# Patient Record
Sex: Male | Born: 1980 | Race: White | Hispanic: No | Marital: Married | Smoking: Former smoker
Health system: Southern US, Community
[De-identification: ages and names within clinical notes are randomized; demographics above are authoritative.]

## PROBLEM LIST (undated history)

## (undated) DIAGNOSIS — Z87891 Personal history of nicotine dependence: Secondary | ICD-10-CM

## (undated) HISTORY — DX: Personal history of nicotine dependence: Z87.891

## (undated) HISTORY — PX: TONSILLECTOMY: SUR1361

## (undated) HISTORY — PX: SHOULDER ARTHROSCOPY: SHX128

---

## 2016-02-15 ENCOUNTER — Emergency Department (HOSPITAL_COMMUNITY): Payer: No Typology Code available for payment source

## 2016-02-15 ENCOUNTER — Emergency Department (HOSPITAL_COMMUNITY)
Admission: EM | Admit: 2016-02-15 | Discharge: 2016-02-15 | Disposition: A | Discharge status: Home / Self Care | Payer: No Typology Code available for payment source | Attending: Emergency Medicine | Admitting: Emergency Medicine

## 2016-02-15 ENCOUNTER — Encounter (HOSPITAL_COMMUNITY): Payer: Self-pay | Admitting: Emergency Medicine

## 2016-02-15 DIAGNOSIS — S39012A Strain of muscle, fascia and tendon of lower back, initial encounter: Secondary | ICD-10-CM | POA: Diagnosis not present

## 2016-02-15 DIAGNOSIS — Y999 Unspecified external cause status: Secondary | ICD-10-CM | POA: Insufficient documentation

## 2016-02-15 DIAGNOSIS — Y92410 Unspecified street and highway as the place of occurrence of the external cause: Secondary | ICD-10-CM | POA: Insufficient documentation

## 2016-02-15 DIAGNOSIS — M25511 Pain in right shoulder: Secondary | ICD-10-CM | POA: Diagnosis not present

## 2016-02-15 DIAGNOSIS — S161XXA Strain of muscle, fascia and tendon at neck level, initial encounter: Secondary | ICD-10-CM | POA: Insufficient documentation

## 2016-02-15 DIAGNOSIS — Y939 Activity, unspecified: Secondary | ICD-10-CM | POA: Insufficient documentation

## 2016-02-15 DIAGNOSIS — Z87891 Personal history of nicotine dependence: Secondary | ICD-10-CM | POA: Insufficient documentation

## 2016-02-15 DIAGNOSIS — S199XXA Unspecified injury of neck, initial encounter: Secondary | ICD-10-CM | POA: Diagnosis present

## 2016-02-15 MED ORDER — ACETAMINOPHEN 500 MG PO TABS
1000.0000 mg | ORAL_TABLET | Freq: Once | ORAL | Status: AC
  Administered 2016-02-15: 1000 mg via ORAL
  Filled 2016-02-15: qty 2

## 2016-02-15 MED ORDER — CYCLOBENZAPRINE HCL 5 MG PO TABS: 5.0000 mg | ORAL_TABLET | Freq: Three times a day (TID) | ORAL | 0 refills | Status: DC | PRN

## 2016-02-15 MED ORDER — IBUPROFEN 600 MG PO TABS: 600.0000 mg | ORAL_TABLET | Freq: Four times a day (QID) | ORAL | 0 refills | Status: DC | PRN

## 2016-02-15 NOTE — ED Notes (Signed)
Patient ambulatory to Xray.

## 2016-02-15 NOTE — ED Notes (Signed)
ED Provider at bedside to discuss plan of care and disposition
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
## 2016-03-01 ENCOUNTER — Telehealth: Payer: Self-pay | Admitting: Orthopaedic Surgery
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
## 2016-05-12 ENCOUNTER — Encounter (HOSPITAL_COMMUNITY): Payer: Self-pay | Admitting: Emergency Medicine

## 2016-05-12 ENCOUNTER — Emergency Department (HOSPITAL_COMMUNITY)
Admission: EM | Admit: 2016-05-12 | Discharge: 2016-05-12 | Disposition: A | Discharge status: Home / Self Care | Payer: No Typology Code available for payment source | Attending: Emergency Medicine | Admitting: Emergency Medicine

## 2016-05-12 ENCOUNTER — Emergency Department (HOSPITAL_COMMUNITY): Payer: No Typology Code available for payment source

## 2016-05-12 DIAGNOSIS — S39012A Strain of muscle, fascia and tendon of lower back, initial encounter: Secondary | ICD-10-CM | POA: Diagnosis not present

## 2016-05-12 DIAGNOSIS — Z87891 Personal history of nicotine dependence: Secondary | ICD-10-CM | POA: Insufficient documentation

## 2016-05-12 DIAGNOSIS — S161XXA Strain of muscle, fascia and tendon at neck level, initial encounter: Secondary | ICD-10-CM | POA: Insufficient documentation

## 2016-05-12 DIAGNOSIS — Y9389 Activity, other specified: Secondary | ICD-10-CM | POA: Insufficient documentation

## 2016-05-12 DIAGNOSIS — Y92410 Unspecified street and highway as the place of occurrence of the external cause: Secondary | ICD-10-CM | POA: Diagnosis not present

## 2016-05-12 DIAGNOSIS — Y999 Unspecified external cause status: Secondary | ICD-10-CM | POA: Diagnosis not present

## 2016-05-12 DIAGNOSIS — S199XXA Unspecified injury of neck, initial encounter: Secondary | ICD-10-CM | POA: Diagnosis present

## 2016-05-12 MED ORDER — METHOCARBAMOL 500 MG PO TABS: 500.0000 mg | ORAL_TABLET | Freq: Four times a day (QID) | ORAL | 0 refills | Status: DC

## 2016-05-12 MED ORDER — DICLOFENAC SODIUM 50 MG PO TBEC: 50.0000 mg | DELAYED_RELEASE_TABLET | Freq: Two times a day (BID) | ORAL | 0 refills | Status: DC
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
## 2016-05-12 NOTE — Discharge Instructions (Signed)
See your Physician for recheck in 1 week if pain persist °
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
## 2016-05-12 NOTE — ED Notes (Signed)
C-collar applied to pt.

## 2017-04-17 ENCOUNTER — Other Ambulatory Visit (HOSPITAL_COMMUNITY): Payer: Self-pay | Admitting: Nurse Practitioner

## 2017-04-17 DIAGNOSIS — R1030 Lower abdominal pain, unspecified: Secondary | ICD-10-CM

## 2017-04-17 DIAGNOSIS — N5082 Scrotal pain: Secondary | ICD-10-CM
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
## 2021-03-06 DIAGNOSIS — G8929 Other chronic pain: Secondary | ICD-10-CM

## 2021-03-06 HISTORY — DX: Other chronic pain: G89.29

## 2021-04-01 ENCOUNTER — Encounter: Payer: Self-pay | Admitting: Medical

## 2021-04-01 ENCOUNTER — Other Ambulatory Visit: Payer: Self-pay

## 2021-04-01 ENCOUNTER — Ambulatory Visit (INDEPENDENT_AMBULATORY_CARE_PROVIDER_SITE_OTHER): Payer: BC Managed Care – PPO | Admitting: Medical

## 2021-04-01 VITALS — BP 130/80 | HR 97 | Ht 71.0 in | Wt 184.2 lb

## 2021-04-01 DIAGNOSIS — M549 Dorsalgia, unspecified: Secondary | ICD-10-CM | POA: Diagnosis not present

## 2021-04-01 DIAGNOSIS — M25519 Pain in unspecified shoulder: Secondary | ICD-10-CM

## 2021-04-01 DIAGNOSIS — R0789 Other chest pain: Secondary | ICD-10-CM

## 2021-04-01 DIAGNOSIS — Z13220 Encounter for screening for lipoid disorders: Secondary | ICD-10-CM | POA: Insufficient documentation

## 2021-04-01 DIAGNOSIS — Z0000 Encounter for general adult medical examination without abnormal findings: Secondary | ICD-10-CM | POA: Diagnosis not present

## 2021-04-01 DIAGNOSIS — G8929 Other chronic pain: Secondary | ICD-10-CM

## 2021-04-01 DIAGNOSIS — M25512 Pain in left shoulder: Secondary | ICD-10-CM | POA: Insufficient documentation

## 2021-04-01 DIAGNOSIS — Z23 Encounter for immunization: Secondary | ICD-10-CM | POA: Diagnosis not present
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
## 2021-04-01 NOTE — Addendum Note (Signed)
Addended by: Herminio Commons A on: 04/01/2021 11:56 AM   Modules accepted: Orders

## 2021-04-02 LAB — URINALYSIS, ROUTINE W REFLEX MICROSCOPIC
Bilirubin, UA: NEGATIVE
Glucose, UA: NEGATIVE
Nitrite, UA: NEGATIVE
RBC, UA: NEGATIVE
Specific Gravity, UA: 1.025 (ref 1.005–1.030)
Urobilinogen, Ur: 1 mg/dL (ref 0.2–1.0)
pH, UA: 5.5 (ref 5.0–7.5)

## 2021-04-02 LAB — CBC
Hematocrit: 46.9 % (ref 37.5–51.0)
Hemoglobin: 16.4 g/dL (ref 13.0–17.7)
MCH: 30.6 pg (ref 26.6–33.0)
MCHC: 35 g/dL (ref 31.5–35.7)
MCV: 88 fL (ref 79–97)
Platelets: 252 10*3/uL (ref 150–450)
RBC: 5.36 x10E6/uL (ref 4.14–5.80)
RDW: 12.4 % (ref 11.6–15.4)
WBC: 8.9 10*3/uL (ref 3.4–10.8)

## 2021-04-02 LAB — COMPREHENSIVE METABOLIC PANEL
ALT: 18 IU/L (ref 0–44)
AST: 16 IU/L (ref 0–40)
Albumin/Globulin Ratio: 2.2 (ref 1.2–2.2)
Albumin: 4.8 g/dL (ref 4.0–5.0)
Alkaline Phosphatase: 79 IU/L (ref 44–121)
BUN/Creatinine Ratio: 10 (ref 9–20)
BUN: 13 mg/dL (ref 6–24)
Bilirubin Total: 0.7 mg/dL (ref 0.0–1.2)
CO2: 29 mmol/L (ref 20–29)
Calcium: 10.2 mg/dL (ref 8.7–10.2)
Chloride: 100 mmol/L (ref 96–106)
Creatinine, Ser: 1.35 mg/dL — ABNORMAL HIGH (ref 0.76–1.27)
Globulin, Total: 2.2 g/dL (ref 1.5–4.5)
Glucose: 106 mg/dL — ABNORMAL HIGH (ref 70–99)
Potassium: 4.8 mmol/L (ref 3.5–5.2)
Sodium: 141 mmol/L (ref 134–144)
Total Protein: 7 g/dL (ref 6.0–8.5)
eGFR: 68 mL/min/{1.73_m2} (ref >59)

## 2021-04-02 LAB — LIPID PANEL
Chol/HDL Ratio: 5.9 ratio — ABNORMAL HIGH (ref 0.0–5.0)
Cholesterol, Total: 248 mg/dL — ABNORMAL HIGH (ref 100–199)
HDL: 42 mg/dL (ref >39)
LDL Chol Calc (NIH): 164 mg/dL — ABNORMAL HIGH (ref 0–99)
Triglycerides: 227 mg/dL — ABNORMAL HIGH (ref 0–149)
VLDL Cholesterol Cal: 42 mg/dL — ABNORMAL HIGH (ref 5–40)

## 2021-04-02 LAB — CK: Total CK: 81 U/L (ref 49–439)

## 2021-04-02 LAB — MICROSCOPIC EXAMINATION
Bacteria, UA: NONE SEEN
Casts: NONE SEEN /lpf
WBC, UA: NONE SEEN /hpf (ref 0–5)

## 2021-05-13 DIAGNOSIS — M542 Cervicalgia: Secondary | ICD-10-CM | POA: Diagnosis not present

## 2021-05-13 DIAGNOSIS — M545 Low back pain, unspecified: Secondary | ICD-10-CM | POA: Diagnosis not present

## 2021-06-10 DIAGNOSIS — M545 Low back pain, unspecified: Secondary | ICD-10-CM | POA: Diagnosis not present

## 2021-07-01 ENCOUNTER — Ambulatory Visit: Payer: BC Managed Care – PPO | Admitting: Medical

## 2021-07-08 ENCOUNTER — Encounter: Payer: Self-pay | Admitting: Medical

## 2021-07-08 ENCOUNTER — Ambulatory Visit: Payer: BC Managed Care – PPO | Admitting: Medical

## 2021-07-08 VITALS — BP 114/72 | HR 79 | Temp 97.3°F | Wt 185.8 lb

## 2021-07-08 DIAGNOSIS — R5383 Other fatigue: Secondary | ICD-10-CM

## 2021-07-08 DIAGNOSIS — R7309 Other abnormal glucose: Secondary | ICD-10-CM

## 2021-07-08 DIAGNOSIS — G479 Sleep disorder, unspecified: Secondary | ICD-10-CM

## 2021-07-08 DIAGNOSIS — R7989 Other specified abnormal findings of blood chemistry: Secondary | ICD-10-CM | POA: Diagnosis not present

## 2021-07-08 DIAGNOSIS — E7889 Other lipoprotein metabolism disorders: Secondary | ICD-10-CM

## 2021-07-08 NOTE — Progress Notes (Signed)
Subjective: ? Rod Majerus is a 41 y.o. male who presents for ?Chief Complaint  ?Patient presents with  ? Follow-up  ?  On labs pt is fasting   ?   ?Here for recheck.  At his physical in 03/2021, labs showed elevated glucose, lipids, and mildly elevated creatine.  Here to recheck.  Since last visit has made significant dietary changes.  Cut out sodas, heavy breakfast sandwiches, pizza.  Eating more greek yogurt, less breakfast samples, pretzels ? ?Has concerns about fatigue.  A friend of his has low testosterone.  Wants this checked.  Only gets 4-5 hours per night of sleep.  Has horses and other animals he tends to daily.  Drives 2 hour commute daily.  Has fit bit but it hasn't registered any abnormal EKG or abnormal sleep concerns ? ?No witnessed apnea, no loud snoring that he is aware of ? ?No other aggravating or relieving factors.   ? ?No other c/o. ? ?The following portions of the patient's history were reviewed and updated as appropriate: allergies, current medications, past family history, past medical history, past social history, past surgical history and problem list. ? ?ROS ?Otherwise as in subjective above ? ? ? ?Objective: ?BP 114/72   Pulse 79   Temp (!) 97.3 ?F (36.3 ?C)   Wt 185 lb 12.8 oz (84.3 kg)   SpO2 97%   BMI 25.91 kg/m?  ? ?General appearance: alert, no distress, well developed, well nourished ?Otherwise not examined ? ? ? ?Assessment: ?Encounter Diagnoses  ?Name Primary?  ? Elevated glucose Yes  ? Lipids abnormal   ? Elevated serum creatinine   ? Other fatigue   ? ? ? ?Plan: ?Elevated lipids-he has made dietary changes since last visit.  Updated fasting lipids today ? ?Elevated glucose-updated labs today since he has made dietary changes ? ?Elevated creatinine on labs in January.  We discussed possible causes.  He has no known hypertension, no heavy NSAID use, but he says he probably could drink more water in general.  He was also sick a week or so before the visit we saw him as he  thinks he may have been a little dehydrated ? ?Testosterone level today at his request ? ? ?Jansen was seen today for follow-up. ? ?Diagnoses and all orders for this visit: ? ?Elevated glucose ?-     Hemoglobin A1c ?-     Basic metabolic panel ? ?Lipids abnormal ?-     Lipid panel ? ?Elevated serum creatinine ?-     Basic metabolic panel ? ?Other fatigue ?-     Testosterone ? ? ?Follow up: pending labs, yearly physical ?

## 2021-07-09 LAB — BASIC METABOLIC PANEL
BUN/Creatinine Ratio: 15 (ref 9–20)
BUN: 20 mg/dL (ref 6–24)
CO2: 27 mmol/L (ref 20–29)
Calcium: 9.8 mg/dL (ref 8.7–10.2)
Chloride: 99 mmol/L (ref 96–106)
Creatinine, Ser: 1.32 mg/dL — ABNORMAL HIGH (ref 0.76–1.27)
Glucose: 109 mg/dL — ABNORMAL HIGH (ref 70–99)
Potassium: 4.6 mmol/L (ref 3.5–5.2)
Sodium: 140 mmol/L (ref 134–144)
eGFR: 70 mL/min/{1.73_m2} (ref >59)

## 2021-07-09 LAB — LIPID PANEL
Chol/HDL Ratio: 5.3 ratio — ABNORMAL HIGH (ref 0.0–5.0)
Cholesterol, Total: 239 mg/dL — ABNORMAL HIGH (ref 100–199)
HDL: 45 mg/dL (ref >39)
LDL Chol Calc (NIH): 170 mg/dL — ABNORMAL HIGH (ref 0–99)
Triglycerides: 134 mg/dL (ref 0–149)
VLDL Cholesterol Cal: 24 mg/dL (ref 5–40)

## 2021-07-09 LAB — TESTOSTERONE: Testosterone: 433 ng/dL (ref 264–916)

## 2021-07-09 LAB — HEMOGLOBIN A1C
Est. average glucose Bld gHb Est-mCnc: 117 mg/dL
Hgb A1c MFr Bld: 5.7 % — ABNORMAL HIGH (ref 4.8–5.6)

## 2021-08-17 ENCOUNTER — Telehealth: Payer: Self-pay | Admitting: Medical

## 2021-08-17 NOTE — Telephone Encounter (Signed)
Pt called, they rescued some kittens and have been handling them for about 7 weeks and they have round worms He had read that you can get a preventative for round worms that you take for about 3 days He wanted to know if that is something that you could prescribe  Please call

## 2021-08-19 ENCOUNTER — Other Ambulatory Visit: Payer: Self-pay | Admitting: Medical

## 2021-08-19 MED ORDER — ALBENDAZOLE 200 MG PO TABS: 400.0000 mg | ORAL_TABLET | Freq: Every day | ORAL | 0 refills | Status: AC

## 2021-08-19 NOTE — Telephone Encounter (Signed)
Pt was notified.  

## 2021-11-09 ENCOUNTER — Encounter: Payer: Self-pay | Admitting: Internal Medicine

## 2021-12-13 ENCOUNTER — Encounter: Payer: Self-pay | Admitting: Internal Medicine

## 2022-04-07 ENCOUNTER — Encounter: Payer: BC Managed Care – PPO | Admitting: Medical

## 2022-06-23 ENCOUNTER — Encounter: Payer: BC Managed Care – PPO | Admitting: Medical

## 2022-09-15 ENCOUNTER — Encounter: Payer: BC Managed Care – PPO | Admitting: Medical

## 2022-10-25 ENCOUNTER — Encounter: Payer: BC Managed Care – PPO | Admitting: Medical

## 2022-10-25 ENCOUNTER — Telehealth: Payer: Self-pay | Admitting: Medical

## 2022-10-25 NOTE — Telephone Encounter (Signed)
This patient no showed for their appointment today.Which of the following is necessary for this patient.   A) No follow-up necessary   B) Follow-up urgent. Locate Patient Immediately.   C) Follow-up necessary. Contact patient and Schedule visit in ____ Days.   D) Follow-up Advised. Contact patient and Schedule visit in ____ Days.   E) Please Send no show letter to patient. Charge no show fee if no show was a CPE.
